# Patient Record
Sex: Male | Born: 1985 | Race: Black or African American | Hispanic: No | Marital: Married | Smoking: Never smoker
Health system: Southern US, Community
[De-identification: ages and names within clinical notes are randomized; demographics above are authoritative.]

---

## 2008-01-10 ENCOUNTER — Emergency Department (HOSPITAL_COMMUNITY): Admission: EM | Admit: 2008-01-10 | Discharge: 2008-01-11 | Payer: Self-pay | Admitting: Emergency Medicine

## 2008-02-06 ENCOUNTER — Emergency Department (HOSPITAL_COMMUNITY): Admission: EM | Admit: 2008-02-06 | Discharge: 2008-02-07 | Payer: Self-pay | Admitting: Emergency Medicine

## 2010-03-04 IMAGING — CR DG FINGER RING 2+V*L*
3 series · 3 of 3 positions shown · non-contrast
Comparison: None

CLINICAL DATA: Injury. Pain was bent backwards.  Pain.

LEFT RING FINGER 2+V

[x finger pa left]
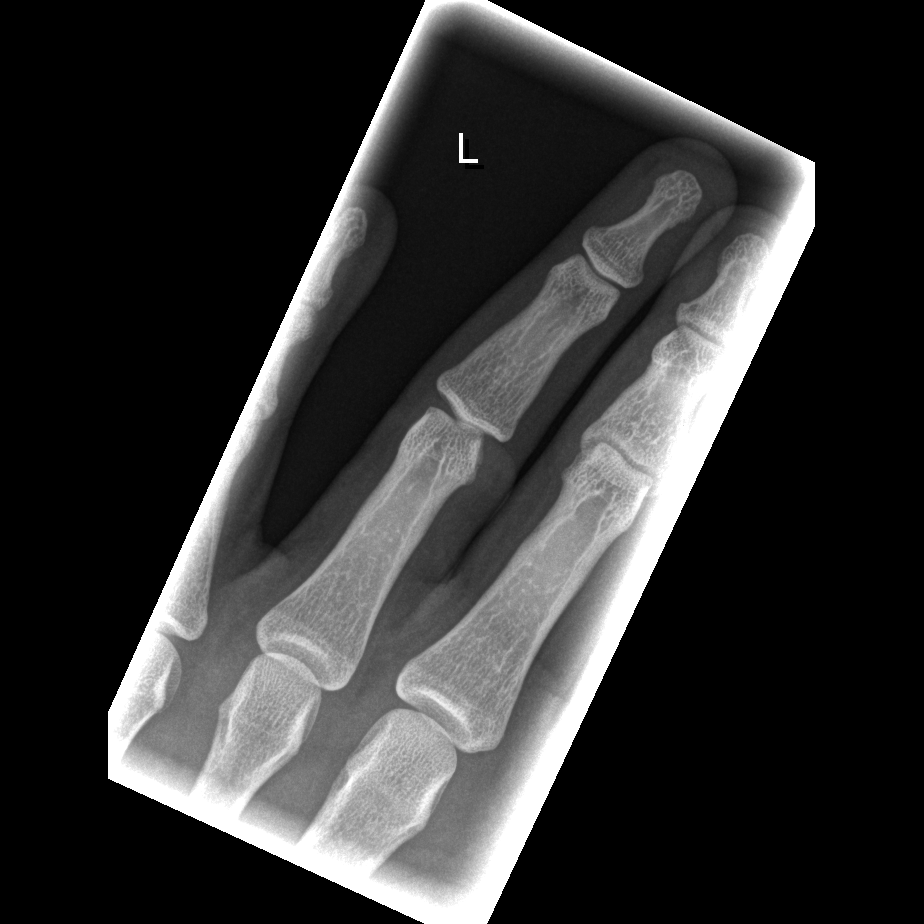

[x finger obl. left]
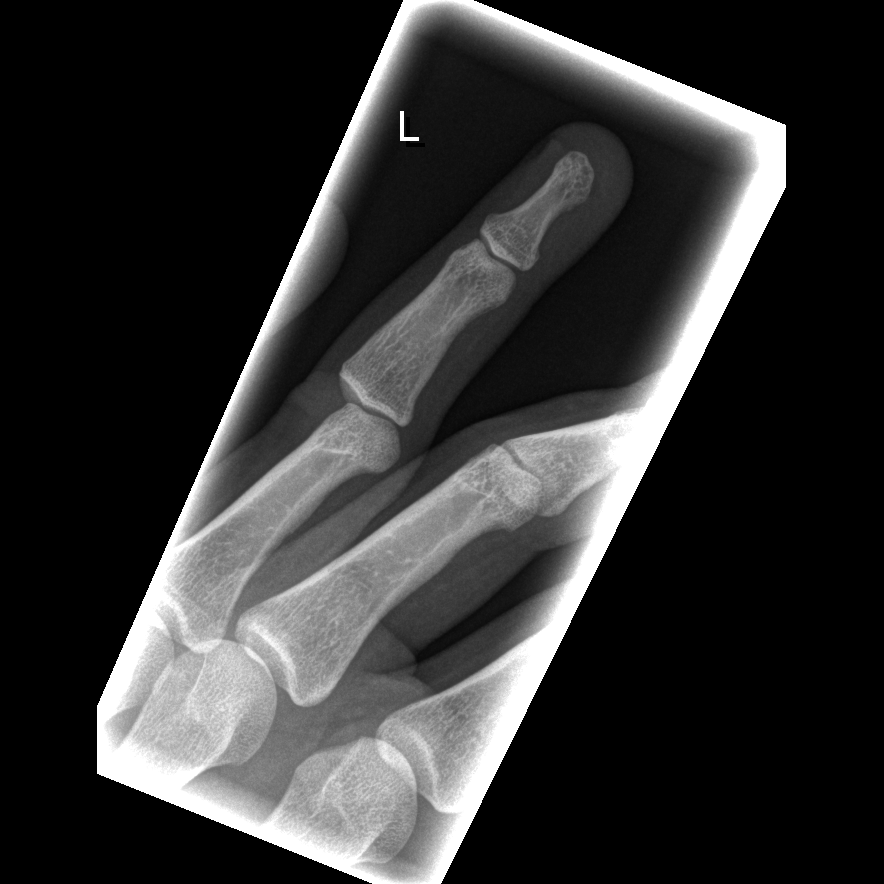

[x finger lateral left]
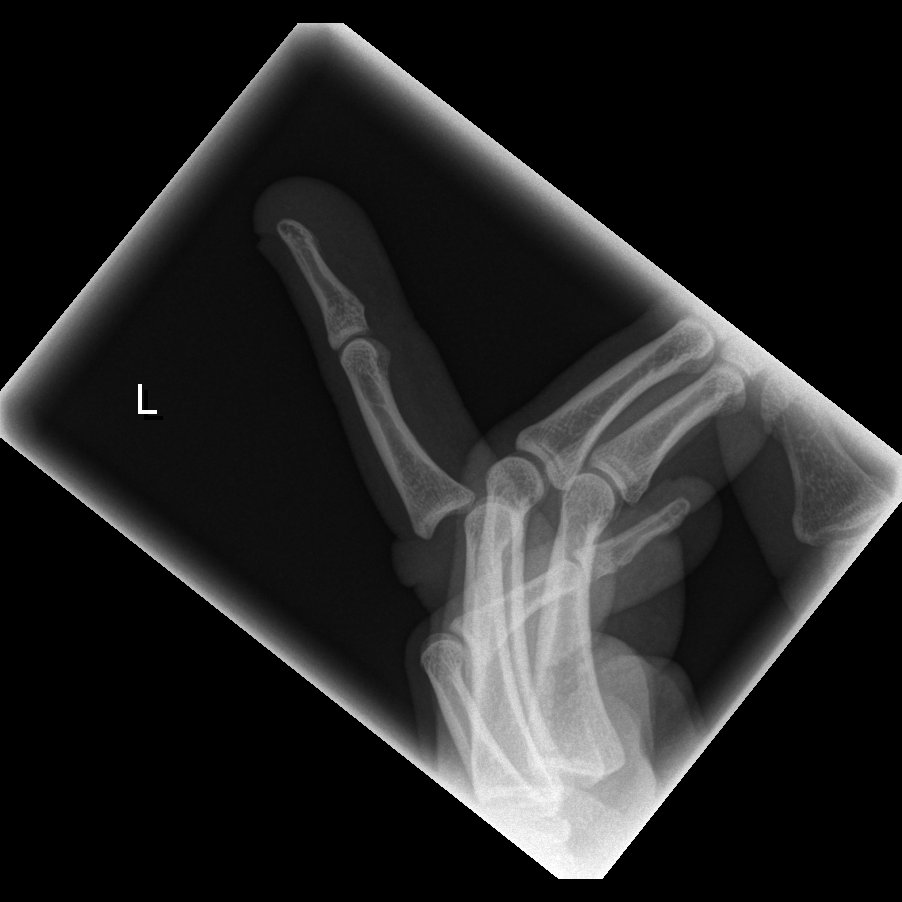

[3 of 3 positions shown; findings below may reference images not displayed]

FINDINGS: Posterior dislocation of the middle on the proximal
phalanx.  No fracture.
IMPRESSION: PIP dislocation.

## 2011-04-02 LAB — URINALYSIS, ROUTINE W REFLEX MICROSCOPIC
Bilirubin Urine: NEGATIVE
Ketones, ur: 15 — AB
Nitrite: NEGATIVE
Protein, ur: 100 — AB
Urobilinogen, UA: 1

## 2011-04-02 LAB — CBC
HCT: 41.8
Hemoglobin: 14.5
MCHC: 34.7
RBC: 4.64

## 2011-04-02 LAB — DIFFERENTIAL
Basophils Relative: 0
Lymphocytes Relative: 5 — ABNORMAL LOW
Monocytes Absolute: 0.6
Monocytes Relative: 5
Neutro Abs: 9.9 — ABNORMAL HIGH

## 2011-04-02 LAB — BASIC METABOLIC PANEL
CO2: 24
Calcium: 9.3
GFR calc Af Amer: >60
Potassium: 4
Sodium: 134 — ABNORMAL LOW

## 2011-04-02 LAB — URINE MICROSCOPIC-ADD ON

## 2017-10-12 NOTE — Progress Notes (Deleted)
   Subjective:    Patient ID: Marc Carrillo, male    DOB: August 14, 1985, 32 y.o.   MRN: [id number]  HPI :  Mr. Marc Carrillo is here to establish as a new pt.  He is a pleasant 32 year old male.  PMH:    Review of Systems     Objective:   Physical Exam        Assessment & Plan:

## 2017-10-13 ENCOUNTER — Ambulatory Visit: Payer: Self-pay | Admitting: Adult Health

## 2021-04-04 DIAGNOSIS — Z1389 Encounter for screening for other disorder: Secondary | ICD-10-CM | POA: Diagnosis not present

## 2021-04-04 DIAGNOSIS — E782 Mixed hyperlipidemia: Secondary | ICD-10-CM | POA: Diagnosis not present

## 2021-04-04 DIAGNOSIS — R7303 Prediabetes: Secondary | ICD-10-CM | POA: Diagnosis not present

## 2021-04-04 DIAGNOSIS — R3121 Asymptomatic microscopic hematuria: Secondary | ICD-10-CM | POA: Diagnosis not present

## 2021-04-04 DIAGNOSIS — I10 Essential (primary) hypertension: Secondary | ICD-10-CM | POA: Diagnosis not present

## 2021-04-15 DIAGNOSIS — I10 Essential (primary) hypertension: Secondary | ICD-10-CM | POA: Diagnosis not present

## 2021-04-15 DIAGNOSIS — E785 Hyperlipidemia, unspecified: Secondary | ICD-10-CM | POA: Diagnosis not present

## 2021-04-15 DIAGNOSIS — E663 Overweight: Secondary | ICD-10-CM | POA: Diagnosis not present

## 2021-04-15 DIAGNOSIS — R7303 Prediabetes: Secondary | ICD-10-CM | POA: Diagnosis not present

## 2021-05-16 DIAGNOSIS — R319 Hematuria, unspecified: Secondary | ICD-10-CM | POA: Diagnosis not present

## 2021-06-03 DIAGNOSIS — Z79899 Other long term (current) drug therapy: Secondary | ICD-10-CM | POA: Diagnosis not present

## 2021-07-16 DIAGNOSIS — I10 Essential (primary) hypertension: Secondary | ICD-10-CM | POA: Diagnosis not present

## 2021-07-16 DIAGNOSIS — E782 Mixed hyperlipidemia: Secondary | ICD-10-CM | POA: Diagnosis not present

## 2021-07-16 DIAGNOSIS — R7303 Prediabetes: Secondary | ICD-10-CM | POA: Diagnosis not present

## 2021-08-03 DIAGNOSIS — Z20822 Contact with and (suspected) exposure to covid-19: Secondary | ICD-10-CM | POA: Diagnosis not present

## 2021-08-03 DIAGNOSIS — Z03818 Encounter for observation for suspected exposure to other biological agents ruled out: Secondary | ICD-10-CM | POA: Diagnosis not present

## 2022-01-14 DIAGNOSIS — I10 Essential (primary) hypertension: Secondary | ICD-10-CM | POA: Diagnosis not present

## 2022-01-14 DIAGNOSIS — R7303 Prediabetes: Secondary | ICD-10-CM | POA: Diagnosis not present

## 2022-04-15 DIAGNOSIS — Z23 Encounter for immunization: Secondary | ICD-10-CM | POA: Diagnosis not present

## 2022-04-15 DIAGNOSIS — I10 Essential (primary) hypertension: Secondary | ICD-10-CM | POA: Diagnosis not present

## 2022-07-24 DIAGNOSIS — I10 Essential (primary) hypertension: Secondary | ICD-10-CM | POA: Diagnosis not present

## 2022-08-28 DIAGNOSIS — R7303 Prediabetes: Secondary | ICD-10-CM | POA: Diagnosis not present

## 2022-08-28 DIAGNOSIS — I10 Essential (primary) hypertension: Secondary | ICD-10-CM | POA: Diagnosis not present

## 2022-08-28 DIAGNOSIS — E785 Hyperlipidemia, unspecified: Secondary | ICD-10-CM | POA: Diagnosis not present

## 2023-03-05 DIAGNOSIS — E785 Hyperlipidemia, unspecified: Secondary | ICD-10-CM | POA: Diagnosis not present

## 2023-03-05 DIAGNOSIS — Z0000 Encounter for general adult medical examination without abnormal findings: Secondary | ICD-10-CM | POA: Diagnosis not present

## 2023-03-05 DIAGNOSIS — Z79899 Other long term (current) drug therapy: Secondary | ICD-10-CM | POA: Diagnosis not present

## 2023-03-05 DIAGNOSIS — R7303 Prediabetes: Secondary | ICD-10-CM | POA: Diagnosis not present

## 2023-03-05 DIAGNOSIS — E559 Vitamin D deficiency, unspecified: Secondary | ICD-10-CM | POA: Diagnosis not present

## 2023-03-05 DIAGNOSIS — I10 Essential (primary) hypertension: Secondary | ICD-10-CM | POA: Diagnosis not present

## 2023-06-11 ENCOUNTER — Emergency Department

## 2023-06-11 ENCOUNTER — Emergency Department
Admission: EM | Admit: 2023-06-11 | Discharge: 2023-06-11 | Disposition: A | Discharge status: Home / Self Care | Attending: Student in an Organized Health Care Education/Training Program | Admitting: Student in an Organized Health Care Education/Training Program

## 2023-06-11 ENCOUNTER — Other Ambulatory Visit: Payer: Self-pay

## 2023-06-11 DIAGNOSIS — Y990 Civilian activity done for income or pay: Secondary | ICD-10-CM | POA: Insufficient documentation

## 2023-06-11 DIAGNOSIS — S61210A Laceration without foreign body of right index finger without damage to nail, initial encounter: Secondary | ICD-10-CM | POA: Insufficient documentation

## 2023-06-11 DIAGNOSIS — S67190A Crushing injury of right index finger, initial encounter: Secondary | ICD-10-CM | POA: Insufficient documentation

## 2023-06-11 DIAGNOSIS — S6710XA Crushing injury of unspecified finger(s), initial encounter: Secondary | ICD-10-CM

## 2023-06-11 DIAGNOSIS — W230XXA Caught, crushed, jammed, or pinched between moving objects, initial encounter: Secondary | ICD-10-CM | POA: Insufficient documentation

## 2023-06-11 MED ORDER — LIDOCAINE HCL (PF) 1 % IJ SOLN
5.0000 mL | Freq: Once | INTRAMUSCULAR | Status: AC
  Administered 2023-06-11: 5 mL via INTRADERMAL
  Filled 2023-06-11: qty 5

## 2023-06-11 MED ORDER — CEPHALEXIN 500 MG PO CAPS: 500.0000 mg | ORAL_CAPSULE | Freq: Three times a day (TID) | ORAL | 0 refills | Status: AC

## 2023-06-11 NOTE — ED Triage Notes (Signed)
Pt comes with c/o right pointer finger injury while at work. Pt works at [employer] in [place]. Pt states he the door slammed on his finger.

## 2023-06-11 NOTE — Discharge Instructions (Signed)
Keep the area dry for the next 24 to 48 hours.  You may gently wash your hands with soap and water.  Do not soak them in water. Sutures should remain in for 7 to 10 days. Take the antibiotics as prescribed Once the area has stopped oozing you may leave the wound open when at home to let the air help in healing.  If in public please keep it covered with a Band-Aid or dressing

## 2023-06-11 NOTE — ED Provider Notes (Signed)
[facility] Provider Note    Event Date/Time   First MD Initiated Contact with Patient 06/11/23 1749     (approximate)   History   Finger Injury   HPI  Marc Carrillo is a 37 y.o. male with no significant past medical history presents emergency department with right index finger injury while at work.  Patient works at the [employer] office in [place].  Slammed his finger in the door of the truck.  States has a laceration, bruising, and is concerned about a fracture.  Tdap is up-to-date      Physical Exam   Triage Vital Signs: ED Triage Vitals  Encounter Vitals Group     BP 06/11/23 1605 (!) 173/109     Systolic BP Percentile --      Diastolic BP Percentile --      Pulse Rate 06/11/23 1605 97     Resp 06/11/23 1605 18     Temp 06/11/23 1605 98 F (36.7 C)     Temp src --      SpO2 06/11/23 1605 100 %     Weight 06/11/23 1609 165 lb (74.8 kg)     Height 06/11/23 1609 5\' 5"  (1.651 m)     Head Circumference --      Peak Flow --      Pain Score 06/11/23 1609 10     Pain Loc --      Pain Education --      Exclude from Growth Chart --     Most recent vital signs: Vitals:   06/11/23 1605  BP: (!) 173/109  Pulse: 97  Resp: 18  Temp: 98 F (36.7 C)  SpO2: 100%     General: Awake, no distress.   CV:  Good peripheral perfusion. regular rate and  rhythm Resp:  Normal effort.  Abd:  No distention.   Other:  Right index finger with bruising noted dorsally, volar laceration noted, no foreign body, neurovascular is intact   ED Results / Procedures / Treatments   Labs (all labs ordered are listed, but only abnormal results are displayed) Labs Reviewed - No data to display   EKG     RADIOLOGY X-ray of the right index finger    PROCEDURES:   .Laceration Repair  Date/Time: 06/11/2023 6:47 PM  Performed by: Faythe Ghee, PA-C Authorized by: Faythe Ghee, PA-C   Consent:    Consent obtained:  Verbal   Consent given by:   Patient   Risks, benefits, and alternatives were discussed: yes     Risks discussed:  Infection, pain, retained foreign body, tendon damage, poor cosmetic result, need for additional repair, nerve damage, poor wound healing and vascular damage   Alternatives discussed:  No treatment Universal protocol:    Procedure explained and questions answered to patient or proxy's satisfaction: yes     Immediately prior to procedure, a time out was called: yes     Patient identity confirmed:  Verbally with patient Anesthesia:    Anesthesia method:  Nerve block   Block location:  Right index finger   Block needle gauge:  27 G   Block anesthetic:  Lidocaine 1% w/o epi   Block technique:  Digital   Block injection procedure:  Anatomic landmarks identified, introduced needle, incremental injection, anatomic landmarks palpated and negative aspiration for blood   Block outcome:  Anesthesia achieved Laceration details:    Location:  Finger   Finger location:  R index finger  Length (cm):  4 Pre-procedure details:    Preparation:  Patient was prepped and draped in usual sterile fashion and imaging obtained to evaluate for foreign bodies Exploration:    Limited defect created (wound extended): no     Hemostasis achieved with:  Direct pressure   Imaging obtained: x-ray     Imaging outcome: foreign body not noted     Wound exploration: wound explored through full range of motion and entire depth of wound visualized     Wound extent: areolar tissue not violated, fascia not violated, no foreign body, no signs of injury, no nerve damage, no tendon damage, no underlying fracture and no vascular damage     Contaminated: no   Treatment:    Area cleansed with:  Povidone-iodine and saline   Amount of cleaning:  Standard   Irrigation solution:  Sterile saline   Irrigation method:  Tap   Debridement:  None   Undermining:  None Skin repair:    Repair method:  Sutures   Suture size:  5-0   Suture material:   Nylon   Suture technique:  Simple interrupted   Number of sutures:  9 Approximation:    Approximation:  Close Repair type:    Repair type:  Simple Post-procedure details:    Dressing:  Non-adherent dressing   Procedure completion:  Tolerated well, no immediate complications    MEDICATIONS ORDERED IN ED: Medications  lidocaine (PF) (XYLOCAINE) 1 % injection 5 mL (5 mLs Intradermal Given 06/11/23 1818)     IMPRESSION / MDM / ASSESSMENT AND PLAN / ED COURSE  I reviewed the triage vital signs and the nursing notes.                              Differential diagnosis includes, but is not limited to, laceration, contusion, fracture  Patient's presentation is most consistent with acute complicated illness / injury requiring diagnostic workup.   X-ray of the right index finger independently reviewed interpreted by me as being negative for fracture, radiology read pending  See procedure note for laceration repair  Due to the injury happening manage door which would have oil and dirt I did place him on antibiotic.  He is to keep the areas dry as possible.  No use of the right hand for 4 workdays.  Follow-up with urgent care, his regular doctor return emergency department for suture removal in 7 to 10 days.  Patient is in agreement treatment plan.  Take Tylenol or ibuprofen for pain as needed.  Discharged stable condition.      FINAL CLINICAL IMPRESSION(S) / ED DIAGNOSES   Final diagnoses:  Laceration of right index finger without foreign body without damage to nail, initial encounter  Crushing injury of finger, initial encounter     Rx / DC Orders   ED Discharge Orders          Ordered    cephALEXin (KEFLEX) 500 MG capsule  3 times daily        06/11/23 1845             Note:  This document was prepared using Dragon voice recognition software and may include unintentional dictation errors.    Faythe Ghee, PA-C 06/11/23 1850    Willy Eddy,  MD 06/11/23 [phone]

## 2023-06-21 ENCOUNTER — Ambulatory Visit
Admission: EM | Admit: 2023-06-21 | Discharge: 2023-06-21 | Disposition: A | Discharge status: Home / Self Care | Payer: Worker's Compensation | Attending: Family Medicine | Admitting: Family Medicine

## 2023-06-21 ENCOUNTER — Ambulatory Visit (HOSPITAL_COMMUNITY): Payer: Self-pay

## 2023-06-21 DIAGNOSIS — Z4802 Encounter for removal of sutures: Secondary | ICD-10-CM

## 2023-06-21 DIAGNOSIS — S61210D Laceration without foreign body of right index finger without damage to nail, subsequent encounter: Secondary | ICD-10-CM | POA: Diagnosis not present

## 2023-06-21 NOTE — ED Provider Notes (Signed)
[facility]    CSN: [id number] Arrival date & time: 06/21/23  8657      History   Chief Complaint Chief Complaint  Patient presents with   Suture / Staple Removal    HPI Marc Carrillo is a 37 y.o. male.    Suture / Staple Removal  Patient is here for suture removal from right index finger.  Sutures placed in ER 10 days ago, just completed abx.  Here for removal.  Questions rom and sensation at the finger. He works at the [employer].        History reviewed. No pertinent past medical history.  There are no active problems to display for this patient.   History reviewed. No pertinent surgical history.     Home Medications    Prior to Admission medications   Medication Sig Start Date End Date Taking? Authorizing Provider  losartan-hydrochlorothiazide (HYZAAR) 100-25 MG tablet Take 1 tablet by mouth daily.   Yes [provider]  ibuprofen (ADVIL) 800 MG tablet Take 800 mg by mouth every 8 (eight) hours as needed.    [provider]    Family History History reviewed. No pertinent family history.  Social History Social History   Tobacco Use   Smoking status: Never   Smokeless tobacco: Never  Vaping Use   Vaping status: Never Used  Substance Use Topics   Alcohol use: Never   Drug use: Never     Allergies   Patient has no known allergies.   Review of Systems Review of Systems  Constitutional: Negative.   HENT: Negative.    Respiratory: Negative.    Cardiovascular: Negative.   Gastrointestinal: Negative.   Skin:  Positive for wound.     Physical Exam Triage Vital Signs ED Triage Vitals  Encounter Vitals Group     BP 06/21/23 1038 133/78     Systolic BP Percentile --      Diastolic BP Percentile --      Pulse Rate 06/21/23 1038 84     Resp 06/21/23 1038 18     Temp 06/21/23 1038 98.4 F (36.9 C)     Temp Source 06/21/23 1038 Oral     SpO2 06/21/23 1038 97 %     Weight 06/21/23 1037 241 lb (109.3 kg)      Height 06/21/23 1037 5\' 8"  (1.727 m)     Head Circumference --      Peak Flow --      Pain Score 06/21/23 1034 0     Pain Loc --      Pain Education --      Exclude from Growth Chart --    No data found.  Updated Vital Signs BP 133/78 (BP Location: Left Arm)   Pulse 84   Temp 98.4 F (36.9 C) (Oral)   Resp 18   Ht 5\' 8"  (1.727 m)   Wt 109.3 kg   SpO2 97%   BMI 36.64 kg/m   Visual Acuity Right Eye Distance:   Left Eye Distance:   Bilateral Distance:    Right Eye Near:   Left Eye Near:    Bilateral Near:     Physical Exam Constitutional:      Appearance: Normal appearance.  Skin:    Comments: Sutures in place at the pad of the right index finger;  bruising is present.  Tenderness noted;  wound is c/d/I otherwise.   Neurological:     General: No focal deficit present.  Mental Status: He is alert.  Psychiatric:        Mood and Affect: Mood normal.      UC Treatments / Results  Labs (all labs ordered are listed, but only abnormal results are displayed) Labs Reviewed - No data to display  EKG   Radiology No results found.  Procedures Procedures (including critical care time)  Medications Ordered in UC Medications - No data to display  Initial Impression / Assessment and Plan / UC Course  I have reviewed the triage vital signs and the nursing notes.  Pertinent labs & imaging results that were available during my care of the patient were reviewed by me and considered in my medical decision making (see chart for details).    Final Clinical Impressions(s) / UC Diagnoses   Final diagnoses:  Visit for suture removal  Laceration of right index finger without foreign body without damage to nail, subsequent encounter     Discharge Instructions      You were seen today for suture removal.  As discussed, you will experience numbness of the finger for  while.  This may or may not improve.  Your range of motion will likely improve once the stiches  are removed and swelling is reduced.  Follow up with your primary care provider as needed.     ED Prescriptions   None    PDMP not reviewed this encounter.   Jannifer Franklin, MD 06/21/23 1050

## 2023-06-21 NOTE — Discharge Instructions (Addendum)
You were seen today for suture removal.  As discussed, you will experience numbness of the finger for  while.  This may or may not improve.  Your range of motion will likely improve once the stiches are removed and swelling is reduced.  Follow up with your primary care provider as needed.

## 2023-06-21 NOTE — ED Triage Notes (Signed)
Here for "Suture removal". Original visit: [facility] on 06-11-2023; presented to emergency department with right index finger injury while at work. 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   Site: Right Index Finger, 9 sutures done. No fever. Just finished oral antibiotics.

## 2023-09-10 DIAGNOSIS — E785 Hyperlipidemia, unspecified: Secondary | ICD-10-CM | POA: Diagnosis not present

## 2023-09-10 DIAGNOSIS — I10 Essential (primary) hypertension: Secondary | ICD-10-CM | POA: Diagnosis not present

## 2023-09-10 DIAGNOSIS — R7303 Prediabetes: Secondary | ICD-10-CM | POA: Diagnosis not present

## 2024-03-08 ENCOUNTER — Other Ambulatory Visit (HOSPITAL_BASED_OUTPATIENT_CLINIC_OR_DEPARTMENT_OTHER): Payer: Self-pay | Admitting: Internal Medicine

## 2024-03-08 DIAGNOSIS — E785 Hyperlipidemia, unspecified: Secondary | ICD-10-CM

## 2024-03-08 DIAGNOSIS — Z0000 Encounter for general adult medical examination without abnormal findings: Secondary | ICD-10-CM | POA: Diagnosis not present

## 2024-03-08 DIAGNOSIS — I10 Essential (primary) hypertension: Secondary | ICD-10-CM | POA: Diagnosis not present

## 2024-03-08 DIAGNOSIS — Z23 Encounter for immunization: Secondary | ICD-10-CM | POA: Diagnosis not present

## 2024-03-08 DIAGNOSIS — E559 Vitamin D deficiency, unspecified: Secondary | ICD-10-CM | POA: Diagnosis not present

## 2024-03-08 DIAGNOSIS — R7303 Prediabetes: Secondary | ICD-10-CM | POA: Diagnosis not present

## 2024-03-08 DIAGNOSIS — Z79899 Other long term (current) drug therapy: Secondary | ICD-10-CM | POA: Diagnosis not present

## 2024-03-23 ENCOUNTER — Ambulatory Visit (HOSPITAL_COMMUNITY)
Admission: RE | Admit: 2024-03-23 | Discharge: 2024-03-23 | Disposition: A | Discharge status: Home / Self Care | Payer: Self-pay | Source: Ambulatory Visit | Attending: Internal Medicine | Admitting: Internal Medicine

## 2024-03-23 DIAGNOSIS — E785 Hyperlipidemia, unspecified: Secondary | ICD-10-CM | POA: Insufficient documentation

## 2024-03-23 DIAGNOSIS — Z8249 Family history of ischemic heart disease and other diseases of the circulatory system: Secondary | ICD-10-CM | POA: Insufficient documentation

## 2024-03-23 DIAGNOSIS — R911 Solitary pulmonary nodule: Secondary | ICD-10-CM | POA: Insufficient documentation

## 2024-04-19 DIAGNOSIS — R7303 Prediabetes: Secondary | ICD-10-CM | POA: Diagnosis not present

## 2024-04-19 DIAGNOSIS — E785 Hyperlipidemia, unspecified: Secondary | ICD-10-CM | POA: Diagnosis not present

## 2024-05-19 DIAGNOSIS — I10 Essential (primary) hypertension: Secondary | ICD-10-CM | POA: Diagnosis not present

## 2024-05-24 DIAGNOSIS — R0681 Apnea, not elsewhere classified: Secondary | ICD-10-CM | POA: Diagnosis not present

## 2024-05-24 DIAGNOSIS — R0683 Snoring: Secondary | ICD-10-CM | POA: Diagnosis not present

## 2024-05-24 DIAGNOSIS — G479 Sleep disorder, unspecified: Secondary | ICD-10-CM | POA: Diagnosis not present

## 2024-05-24 DIAGNOSIS — R931 Abnormal findings on diagnostic imaging of heart and coronary circulation: Secondary | ICD-10-CM | POA: Diagnosis not present

## 2024-05-24 DIAGNOSIS — I10 Essential (primary) hypertension: Secondary | ICD-10-CM | POA: Diagnosis not present

## 2024-05-24 DIAGNOSIS — R7303 Prediabetes: Secondary | ICD-10-CM | POA: Diagnosis not present

## 2024-06-13 DIAGNOSIS — G4733 Obstructive sleep apnea (adult) (pediatric): Secondary | ICD-10-CM | POA: Diagnosis not present
